# Patient Record
Sex: Male | Born: 1995
Health system: Southern US, Community
[De-identification: ages and names within clinical notes are randomized; demographics above are authoritative.]

## PROBLEM LIST (undated history)

## (undated) HISTORY — PX: CIRCUMCISION: SUR203

---
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
## 2019-08-12 ENCOUNTER — Other Ambulatory Visit: Payer: Self-pay

## 2019-08-12 ENCOUNTER — Encounter (HOSPITAL_BASED_OUTPATIENT_CLINIC_OR_DEPARTMENT_OTHER): Payer: Self-pay

## 2019-08-12 ENCOUNTER — Emergency Department (HOSPITAL_BASED_OUTPATIENT_CLINIC_OR_DEPARTMENT_OTHER)
Admission: EM | Admit: 2019-08-12 | Discharge: 2019-08-12 | Disposition: A | Discharge status: Home / Self Care | Payer: Self-pay | Attending: Emergency Medicine | Admitting: Emergency Medicine

## 2019-08-12 ENCOUNTER — Emergency Department (HOSPITAL_BASED_OUTPATIENT_CLINIC_OR_DEPARTMENT_OTHER): Payer: Self-pay

## 2019-08-12 DIAGNOSIS — R05 Cough: Secondary | ICD-10-CM | POA: Insufficient documentation

## 2019-08-12 DIAGNOSIS — Z20828 Contact with and (suspected) exposure to other viral communicable diseases: Secondary | ICD-10-CM | POA: Insufficient documentation

## 2019-08-12 DIAGNOSIS — B349 Viral infection, unspecified: Secondary | ICD-10-CM | POA: Insufficient documentation

## 2019-08-12 DIAGNOSIS — F1210 Cannabis abuse, uncomplicated: Secondary | ICD-10-CM | POA: Insufficient documentation

## 2019-08-12 DIAGNOSIS — R059 Cough, unspecified: Secondary | ICD-10-CM

## 2019-08-12 NOTE — ED Notes (Signed)
Pt denies fevers, body aches.

## 2019-08-12 NOTE — ED Provider Notes (Signed)
[place] [facility] EMERGENCY DEPARTMENT Provider Note   CSN: [id number] Arrival date & time: 08/12/19  1233     History   Chief Complaint Chief Complaint  Patient presents with  . Cough    HPI Kenneth Salinas is a 23 y.o. male who presents to the ED today complaining of gradual onset, constant, productive cough x 1 week.  Reports that he saw his mother on Sunday and was told that she tested positive for COVID-19 yesterday.  He has not been taking anything for his symptoms.  Denies fever, chills, shortness of breath, diarrhea, sore throat, ear pain, any other associated symptoms.      History reviewed. No pertinent past medical history.  There are no active problems to display for this patient.   Past Surgical History:  Procedure Laterality Date  . CIRCUMCISION          Home Medications    Prior to Admission medications   Medication Sig Start Date End Date Taking? Authorizing Provider  acetaminophen (TYLENOL) 500 MG tablet Take 500 mg by mouth every 6 (six) hours as needed. For pain     [provider]  ALBUTEROL SULFATE IN Inhale into the lungs.    [provider]  cyclobenzaprine (FLEXERIL) 10 MG tablet Take 1 tablet (10 mg total) by mouth 2 (two) times daily as needed for muscle spasms. 05/12/16   Joy, Shawn C, PA-C  erythromycin (ERY-TAB) 250 MG EC tablet Take 250 mg by mouth 4 (four) times daily.    [provider]  guaiFENesin-dextromethorphan (ROBITUSSIN DM) 100-10 MG/5ML syrup Take 5 mLs by mouth 3 (three) times daily as needed for cough. 07/08/13   Rancour, Annie Main, MD  ibuprofen (ADVIL,MOTRIN) 800 MG tablet Take 1 tablet (800 mg total) by mouth 3 (three) times daily. 05/12/16   Lorayne Bender, PA-C    Family History No family history on file.  Social History Social History   Tobacco Use  . Smoking status: Never Smoker  . Smokeless tobacco: Never Used  Substance Use Topics  . Alcohol use: No  . Drug use: Yes    Types:  Marijuana     Allergies   Patient has no known allergies.   Review of Systems Review of Systems  Constitutional: Negative for chills and fever.  HENT: Negative for congestion, ear pain and sore throat.   Respiratory: Positive for cough. Negative for shortness of breath.   Gastrointestinal: Negative for diarrhea.     Physical Exam Updated Vital Signs BP (!) 128/91 (BP Location: Left Arm)   Pulse 61   Temp 98 F (36.7 C) (Oral)   Resp 14   SpO2 100%   Physical Exam Vitals signs and nursing note reviewed.  Constitutional:      Appearance: He is not ill-appearing or diaphoretic.  HENT:     Head: Normocephalic and atraumatic.     Right Ear: Tympanic membrane normal.  Eyes:     Conjunctiva/sclera: Conjunctivae normal.  Cardiovascular:     Rate and Rhythm: Normal rate and regular rhythm.     Pulses: Normal pulses.  Pulmonary:     Effort: Pulmonary effort is normal.     Breath sounds: Normal breath sounds. No wheezing, rhonchi or rales.  Lymphadenopathy:     Cervical: No cervical adenopathy.  Skin:    General: Skin is warm and dry.     Coloration: Skin is not jaundiced.  Neurological:     Mental Status: He is alert.  ED Treatments / Results  Labs (all labs ordered are listed, but only abnormal results are displayed) Labs Reviewed  NOVEL CORONAVIRUS, NAA (HOSP ORDER, SEND-OUT TO REF LAB; TAT 18-24 HRS)    EKG None  Radiology Dg Chest Port 1 View  Result Date: 08/12/2019 CLINICAL DATA:  Cough. EXAM: PORTABLE CHEST 1 VIEW COMPARISON:  July 25, 2015 FINDINGS: No edema or consolidation. The heart size and pulmonary vascularity are normal. No adenopathy. No bone lesions. IMPRESSION: No edema or consolidation.  Cardiac silhouette within normal limits. Electronically Signed   By: Bretta Bang III M.D.   On: 08/12/2019 13:45    Procedures Procedures (including critical care time)  Medications Ordered in ED Medications - No data to display    Initial Impression / Assessment and Plan / ED Course  I have reviewed the triage vital signs and the nursing notes.  Pertinent labs & imaging results that were available during my care of the patient were reviewed by me and considered in my medical decision making (see chart for details).    23 year old male who has had positive COVID-19 exposure with his mother with productive cough for the past week who presents to the ED today.  Vital signs are stable.  Patient afebrile without tachycardia or tachypnea.  His lungs are clear to auscultation bilaterally but given he has had productive cough will obtain chest x-ray today.  Will swab for Covid with outpatient testing.  Patient advised that he will need to stay home and self isolate until he receives his results.  He is advised that even if he test positive that I would suggest staying home until he is symptom-free for about a week.  He does not currently work or go to school and therefore cannot stay home.  Discharge after chest x-ray obtained.  If negative patient will not need any other additional medications at this time.   X-ray negative.  Discharge at this time with instructions to self isolate until he receives his results.  Strict return precautions have been discussed with patient.  He is in agreement with plan at this time and stable for discharge home.   Kenneth Salinas was evaluated in Emergency Department on 08/12/2019 for the symptoms described in the history of present illness. He was evaluated in the context of the global COVID-19 pandemic, which necessitated consideration that the patient might be at risk for infection with the SARS-CoV-2 virus that causes COVID-19. Institutional protocols and algorithms that pertain to the evaluation of patients at risk for COVID-19 are in a state of rapid change based on information released by regulatory bodies including the CDC and federal and state organizations. These policies and algorithms were  followed during the patient's care in the ED.  This note was prepared using Dragon voice recognition software and may include unintentional dictation errors due to the inherent limitations of voice recognition software.    Final Clinical Impressions(s) / ED Diagnoses   Final diagnoses:  Cough  Viral illness    ED Discharge Orders    None       Tanda Rockers, PA-C 08/12/19 1402    Vanetta Mulders, MD 08/21/19 [phone]

## 2019-08-12 NOTE — ED Triage Notes (Addendum)
Pt c/o prod cough x 1 week with +covid exposure-NAD-steady gait

## 2019-08-12 NOTE — Discharge Instructions (Addendum)
Your chest xray was clear. We have sent out a covid swab on you - this can take up to 3 days to return. STAY HOME AND SELF ISOLATE UNTIL YOU RECEIVE YOUR RESULTS. If positive you will need to stay home for 14 days starting today. If negative I would still recommend you staying home until you are symptom free for at least 1 week. Continue monitoring your symptoms at home and checking your temperature daily.      Person Under Monitoring Name: Kenneth Salinas  Location: [address] [place] [address]   Infection Prevention Recommendations for Individuals Confirmed to have, or Being Evaluated for, 2019 Novel Coronavirus (COVID-19) Infection Who Receive Care at Home  Individuals who are confirmed to have, or are being evaluated for, COVID-19 should follow the prevention steps below until a healthcare provider or local or state health department says they can return to normal activities.  Stay home except to get medical care You should restrict activities outside your home, except for getting medical care. Do not go to work, school, or public areas, and do not use public transportation or taxis.  Call ahead before visiting your doctor Before your medical appointment, call the healthcare provider and tell them that you have, or are being evaluated for, COVID-19 infection. This will help the healthcare providers office take steps to keep other people from getting infected. Ask your healthcare provider to call the local or state health department.  Monitor your symptoms Seek prompt medical attention if your illness is worsening (e.g., difficulty breathing). Before going to your medical appointment, call the healthcare provider and tell them that you have, or are being evaluated for, COVID-19 infection. Ask your healthcare provider to call the local or state health department.  Wear a facemask You should wear a facemask that covers your nose and mouth when you are in the same room  with other people and when you visit a healthcare provider. People who live with or visit you should also wear a facemask while they are in the same room with you.  Separate yourself from other people in your home As much as possible, you should stay in a different room from other people in your home. Also, you should use a separate bathroom, if available.  Avoid sharing household items You should not share dishes, drinking glasses, cups, eating utensils, towels, bedding, or other items with other people in your home. After using these items, you should wash them thoroughly with soap and water.  Cover your coughs and sneezes Cover your mouth and nose with a tissue when you cough or sneeze, or you can cough or sneeze into your sleeve. Throw used tissues in a lined trash can, and immediately wash your hands with soap and water for at least 20 seconds or use an alcohol-based hand rub.  Wash your [employer] your hands often and thoroughly with soap and water for at least 20 seconds. You can use an alcohol-based hand sanitizer if soap and water are not available and if your hands are not visibly dirty. Avoid touching your eyes, nose, and mouth with unwashed hands.   Prevention Steps for Caregivers and Household Members of Individuals Confirmed to have, or Being Evaluated for, COVID-19 Infection Being Cared for in the Home  If you live with, or provide care at home for, a person confirmed to have, or being evaluated for, COVID-19 infection please follow these guidelines to prevent infection:  Follow healthcare providers instructions Make sure that you  understand and can help the patient follow any healthcare provider instructions for all care.  Provide for the patients basic needs You should help the patient with basic needs in the home and provide support for getting groceries, prescriptions, and other personal needs.  Monitor the patients symptoms If they are getting sicker, call  his or her medical provider and tell them that the patient has, or is being evaluated for, COVID-19 infection. This will help the healthcare providers office take steps to keep other people from getting infected. Ask the healthcare provider to call the local or state health department.  Limit the number of people who have contact with the patient If possible, have only one caregiver for the patient. Other household members should stay in another home or place of residence. If this is not possible, they should stay in another room, or be separated from the patient as much as possible. Use a separate bathroom, if available. Restrict visitors who do not have an essential need to be in the home.  Keep older adults, very young children, and other sick people away from the patient Keep older adults, very young children, and those who have compromised immune systems or chronic health conditions away from the patient. This includes people with chronic heart, lung, or kidney conditions, diabetes, and cancer.  Ensure good ventilation Make sure that shared spaces in the home have good air flow, such as from an air conditioner or an opened window, weather permitting.  Wash your hands often Wash your hands often and thoroughly with soap and water for at least 20 seconds. You can use an alcohol based hand sanitizer if soap and water are not available and if your hands are not visibly dirty. Avoid touching your eyes, nose, and mouth with unwashed hands. Use disposable paper towels to dry your hands. If not available, use dedicated cloth towels and replace them when they become wet.  Wear a facemask and gloves Wear a disposable facemask at all times in the room and gloves when you touch or have contact with the patients blood, body fluids, and/or secretions or excretions, such as sweat, saliva, sputum, nasal mucus, vomit, urine, or feces.  Ensure the mask fits over your nose and mouth tightly, and do not  touch it during use. Throw out disposable facemasks and gloves after using them. Do not reuse. Wash your hands immediately after removing your facemask and gloves. If your personal clothing becomes contaminated, carefully remove clothing and launder. Wash your hands after handling contaminated clothing. Place all used disposable facemasks, gloves, and other waste in a lined container before disposing them with other household waste. Remove gloves and wash your hands immediately after handling these items.  Do not share dishes, glasses, or other household items with the patient Avoid sharing household items. You should not share dishes, drinking glasses, cups, eating utensils, towels, bedding, or other items with a patient who is confirmed to have, or being evaluated for, COVID-19 infection. After the person uses these items, you should wash them thoroughly with soap and water.  Wash laundry thoroughly Immediately remove and wash clothes or bedding that have blood, body fluids, and/or secretions or excretions, such as sweat, saliva, sputum, nasal mucus, vomit, urine, or feces, on them. Wear gloves when handling laundry from the patient. Read and follow directions on labels of laundry or clothing items and detergent. In general, wash and dry with the warmest temperatures recommended on the label.  Clean all areas the individual has used often  Clean all touchable surfaces, such as counters, tabletops, doorknobs, bathroom fixtures, toilets, phones, keyboards, tablets, and bedside tables, every day. Also, clean any surfaces that may have blood, body fluids, and/or secretions or excretions on them. Wear gloves when cleaning surfaces the patient has come in contact with. Use a diluted bleach solution (e.g., dilute bleach with 1 part bleach and 10 parts water) or a household disinfectant with a label that says EPA-registered for coronaviruses. To make a bleach solution at home, add 1 tablespoon of bleach  to 1 quart (4 cups) of water. For a larger supply, add  cup of bleach to 1 gallon (16 cups) of water. Read labels of cleaning products and follow recommendations provided on product labels. Labels contain instructions for safe and effective use of the cleaning product including precautions you should take when applying the product, such as wearing gloves or eye protection and making sure you have good ventilation during use of the product. Remove gloves and wash hands immediately after cleaning.  Monitor yourself for signs and symptoms of illness Caregivers and household members are considered close contacts, should monitor their health, and will be asked to limit movement outside of the home to the extent possible. Follow the monitoring steps for close contacts listed on the symptom monitoring form.   ? If you have additional questions, contact your local health department or call the epidemiologist on call at [phone] (available 24/7). ? This guidance is subject to change. For the most up-to-date guidance from [facility], please refer to their website: [email or web address]

## 2019-08-12 NOTE — ED Notes (Signed)
X-ray at bedside

## 2019-08-14 ENCOUNTER — Telehealth: Payer: Self-pay | Admitting: General Practice

## 2019-08-14 LAB — NOVEL CORONAVIRUS, NAA (HOSP ORDER, SEND-OUT TO REF LAB; TAT 18-24 HRS): SARS-CoV-2, NAA: NOT DETECTED

## 2019-08-14 NOTE — Telephone Encounter (Signed)
Negative COVID results given. Patient results "NOT Detected." Caller expressed understanding. ° °

## 2019-10-25 ENCOUNTER — Emergency Department (HOSPITAL_BASED_OUTPATIENT_CLINIC_OR_DEPARTMENT_OTHER)
Admission: EM | Admit: 2019-10-25 | Discharge: 2019-10-26 | Disposition: A | Discharge status: Home / Self Care | Payer: Medicaid Other | Attending: Emergency Medicine | Admitting: Emergency Medicine

## 2019-10-25 ENCOUNTER — Encounter (HOSPITAL_BASED_OUTPATIENT_CLINIC_OR_DEPARTMENT_OTHER): Payer: Self-pay | Admitting: *Deleted

## 2019-10-25 ENCOUNTER — Other Ambulatory Visit: Payer: Self-pay

## 2019-10-25 DIAGNOSIS — T50905A Adverse effect of unspecified drugs, medicaments and biological substances, initial encounter: Secondary | ICD-10-CM

## 2019-10-25 DIAGNOSIS — T407X5A Adverse effect of cannabis (derivatives), initial encounter: Secondary | ICD-10-CM | POA: Insufficient documentation

## 2019-10-25 DIAGNOSIS — F12929 Cannabis use, unspecified with intoxication, unspecified: Secondary | ICD-10-CM

## 2019-10-25 NOTE — ED Triage Notes (Signed)
Pt reports he woke up this evening with his heart racing. States "it was beating too fast and I felt weird". HR 66 in triage with O2 sats 99%. Pt admits to smoking weed tonight.

## 2019-10-26 ENCOUNTER — Encounter (HOSPITAL_BASED_OUTPATIENT_CLINIC_OR_DEPARTMENT_OTHER): Payer: Self-pay | Admitting: Emergency Medicine

## 2019-10-26 NOTE — ED Provider Notes (Signed)
[facility] EMERGENCY DEPARTMENT Provider Note   CSN: [id number] Arrival date & time: 10/25/19  2310     History Chief Complaint  Patient presents with  . Palpitations    Kenneth Salinas is a 24 y.o. male.  The history is provided by the patient.  Palpitations Palpitations quality:  Fast Onset quality:  Sudden Timing:  Constant Progression:  Resolved Chronicity:  New Context: illicit drugs   Context comment:  Marijuana Relieved by: a measure of time. Worsened by:  Nothing Ineffective treatments:  None tried Associated symptoms: no back pain, no chest pain, no chest pressure, no cough, no diaphoresis, no dizziness and no hemoptysis   Risk factors: no diabetes mellitus   Patient with palpitations immediately after smoking marijuana.      History reviewed. No pertinent past medical history.  There are no problems to display for this patient.   Past Surgical History:  Procedure Laterality Date  . CIRCUMCISION         History reviewed. No pertinent family history.  Social History   Tobacco Use  . Smoking status: Never Smoker  . Smokeless tobacco: Never Used  Substance Use Topics  . Alcohol use: No  . Drug use: Yes    Types: Marijuana    Home Medications Prior to Admission medications   Medication Sig Start Date End Date Taking? Authorizing Provider  acetaminophen (TYLENOL) 500 MG tablet Take 500 mg by mouth every 6 (six) hours as needed. For pain     [provider]  ALBUTEROL SULFATE IN Inhale into the lungs.    [provider]  cyclobenzaprine (FLEXERIL) 10 MG tablet Take 1 tablet (10 mg total) by mouth 2 (two) times daily as needed for muscle spasms. 05/12/16   Joy, Shawn C, PA-C  erythromycin (ERY-TAB) 250 MG EC tablet Take 250 mg by mouth 4 (four) times daily.    [provider]  guaiFENesin-dextromethorphan (ROBITUSSIN DM) 100-10 MG/5ML syrup Take 5 mLs by mouth 3 (three) times daily as needed for cough. 07/08/13    Rancour, Jeannett Senior, MD  ibuprofen (ADVIL,MOTRIN) 800 MG tablet Take 1 tablet (800 mg total) by mouth 3 (three) times daily. 05/12/16   Joy, Hillard Danker, PA-C    Allergies    Patient has no known allergies.  Review of Systems   Review of Systems  Constitutional: Negative for diaphoresis.  Respiratory: Negative for cough and hemoptysis.   Cardiovascular: Positive for palpitations. Negative for chest pain.  Musculoskeletal: Negative for back pain.  Neurological: Negative for dizziness.    Physical Exam Updated Vital Signs BP 111/74   Pulse 60   Temp 98.7 F (37.1 C) (Oral)   Resp 17   Ht 5\' 8"  (1.727 m)   Wt 93.4 kg   SpO2 97%   BMI 31.32 kg/m   Physical Exam  ED Results / Procedures / Treatments   Labs (all labs ordered are listed, but only abnormal results are displayed) Labs Reviewed - No data to display  EKG EKG Interpretation  Date/Time:  Saturday October 25 2019 23:32:10 EST Ventricular Rate:  58 PR Interval:    QRS Duration: 97 QT Interval:  381 QTC Calculation: 375 R Axis:   37 Text Interpretation: Sinus rhythm ST elev, probable normal early repol pattern When compared with ECG of 07/25/2015, No significant change was found Confirmed by 09/24/2015 (Dione Booze) on 10/26/2019 1:11:34 AM   Radiology No results found.  Procedures Procedures (including critical care time)  Medications Ordered in ED Medications -  No data to display  ED Course  I have reviewed the triage vital signs and the nursing notes.  Pertinent labs & imaging results that were available during my care of the patient were reviewed by me and considered in my medical decision making (see chart for details).    Observed in the ED without any arrhythmia.  Patient sleeping soundly. Advised against any drug use as this is certainly what caused the symptoms.    Kenneth Salinas was evaluated in Emergency Department on 10/26/2019 for the symptoms described in the history of present illness. He was  evaluated in the context of the global COVID-19 pandemic, which necessitated consideration that the patient might be at risk for infection with the SARS-CoV-2 virus that causes COVID-19. Institutional protocols and algorithms that pertain to the evaluation of patients at risk for COVID-19 are in a state of rapid change based on information released by regulatory bodies including the CDC and federal and state organizations. These policies and algorithms were followed during the patient's care in the ED.   Final Clinical Impression(s) / ED Diagnoses Final diagnoses:  Adverse effect of drug, initial encounter    Return for intractable cough, coughing up blood,fevers >100.4 unrelieved by medication, shortness of breath, intractable vomiting, chest pain, shortness of breath, weakness,numbness, changes in speech, facial asymmetry,abdominal pain, passing out,Inability to tolerate liquids or food, cough, altered mental status or any concerns. No signs of systemic illness or infection. The patient is nontoxic-appearing on exam and vital signs are within normal limits.   I have reviewed the triage vital signs and the nursing notes. Pertinent labs &imaging results that were available during my care of the patient were reviewed by me and considered in my medical decision making (see chart for details).  After history, exam, and medical workup I feel the patient has been appropriately medically screened and is safe for discharge home. Pertinent diagnoses were discussed with the patient. Patient was given return   Larned, Tris Howell, MD 10/26/19 [phone]

## 2020-09-18 ENCOUNTER — Emergency Department (HOSPITAL_BASED_OUTPATIENT_CLINIC_OR_DEPARTMENT_OTHER): Payer: Medicaid Other

## 2020-09-18 ENCOUNTER — Emergency Department (HOSPITAL_BASED_OUTPATIENT_CLINIC_OR_DEPARTMENT_OTHER)
Admission: EM | Admit: 2020-09-18 | Discharge: 2020-09-18 | Disposition: A | Discharge status: Home / Self Care | Payer: Medicaid Other | Attending: Emergency Medicine | Admitting: Emergency Medicine

## 2020-09-18 ENCOUNTER — Other Ambulatory Visit: Payer: Self-pay

## 2020-09-18 ENCOUNTER — Encounter (HOSPITAL_BASED_OUTPATIENT_CLINIC_OR_DEPARTMENT_OTHER): Payer: Self-pay | Admitting: Emergency Medicine

## 2020-09-18 DIAGNOSIS — Z5321 Procedure and treatment not carried out due to patient leaving prior to being seen by health care provider: Secondary | ICD-10-CM | POA: Insufficient documentation

## 2020-09-18 DIAGNOSIS — Z20822 Contact with and (suspected) exposure to covid-19: Secondary | ICD-10-CM | POA: Insufficient documentation

## 2020-09-18 DIAGNOSIS — R059 Cough, unspecified: Secondary | ICD-10-CM | POA: Insufficient documentation

## 2020-09-18 LAB — RESP PANEL BY RT-PCR (FLU A&B, COVID) ARPGX2
Influenza A by PCR: NEGATIVE
Influenza B by PCR: NEGATIVE
SARS Coronavirus 2 by RT PCR: NEGATIVE

## 2020-09-18 LAB — GROUP A STREP BY PCR: Group A Strep by PCR: NOT DETECTED

## 2020-09-18 NOTE — ED Triage Notes (Signed)
Pt arrives pov with c/o sore throat yesterday and cough today. Pt denies fever, not vaccinated for Covid. Pt also endorses emesis, reports that he thinks r/t coughing

## 2020-09-18 NOTE — ED Notes (Signed)
Called X 3 with no answer.  Removed from board at this time.

## 2020-11-29 ENCOUNTER — Other Ambulatory Visit: Payer: Self-pay

## 2020-11-29 ENCOUNTER — Other Ambulatory Visit: Payer: Medicaid Other

## 2020-11-29 DIAGNOSIS — Z20822 Contact with and (suspected) exposure to covid-19: Secondary | ICD-10-CM

## 2020-11-30 LAB — SARS-COV-2, NAA 2 DAY TAT

## 2020-11-30 LAB — NOVEL CORONAVIRUS, NAA: SARS-CoV-2, NAA: NOT DETECTED

## 2021-05-29 IMAGING — DX DG CHEST 1V PORT
1 series · 1 of 1 positions shown · non-contrast
Comparison: July 25, 2015

CLINICAL DATA: Cough.

EXAM:
PORTABLE CHEST 1 VIEW

[chest ap]
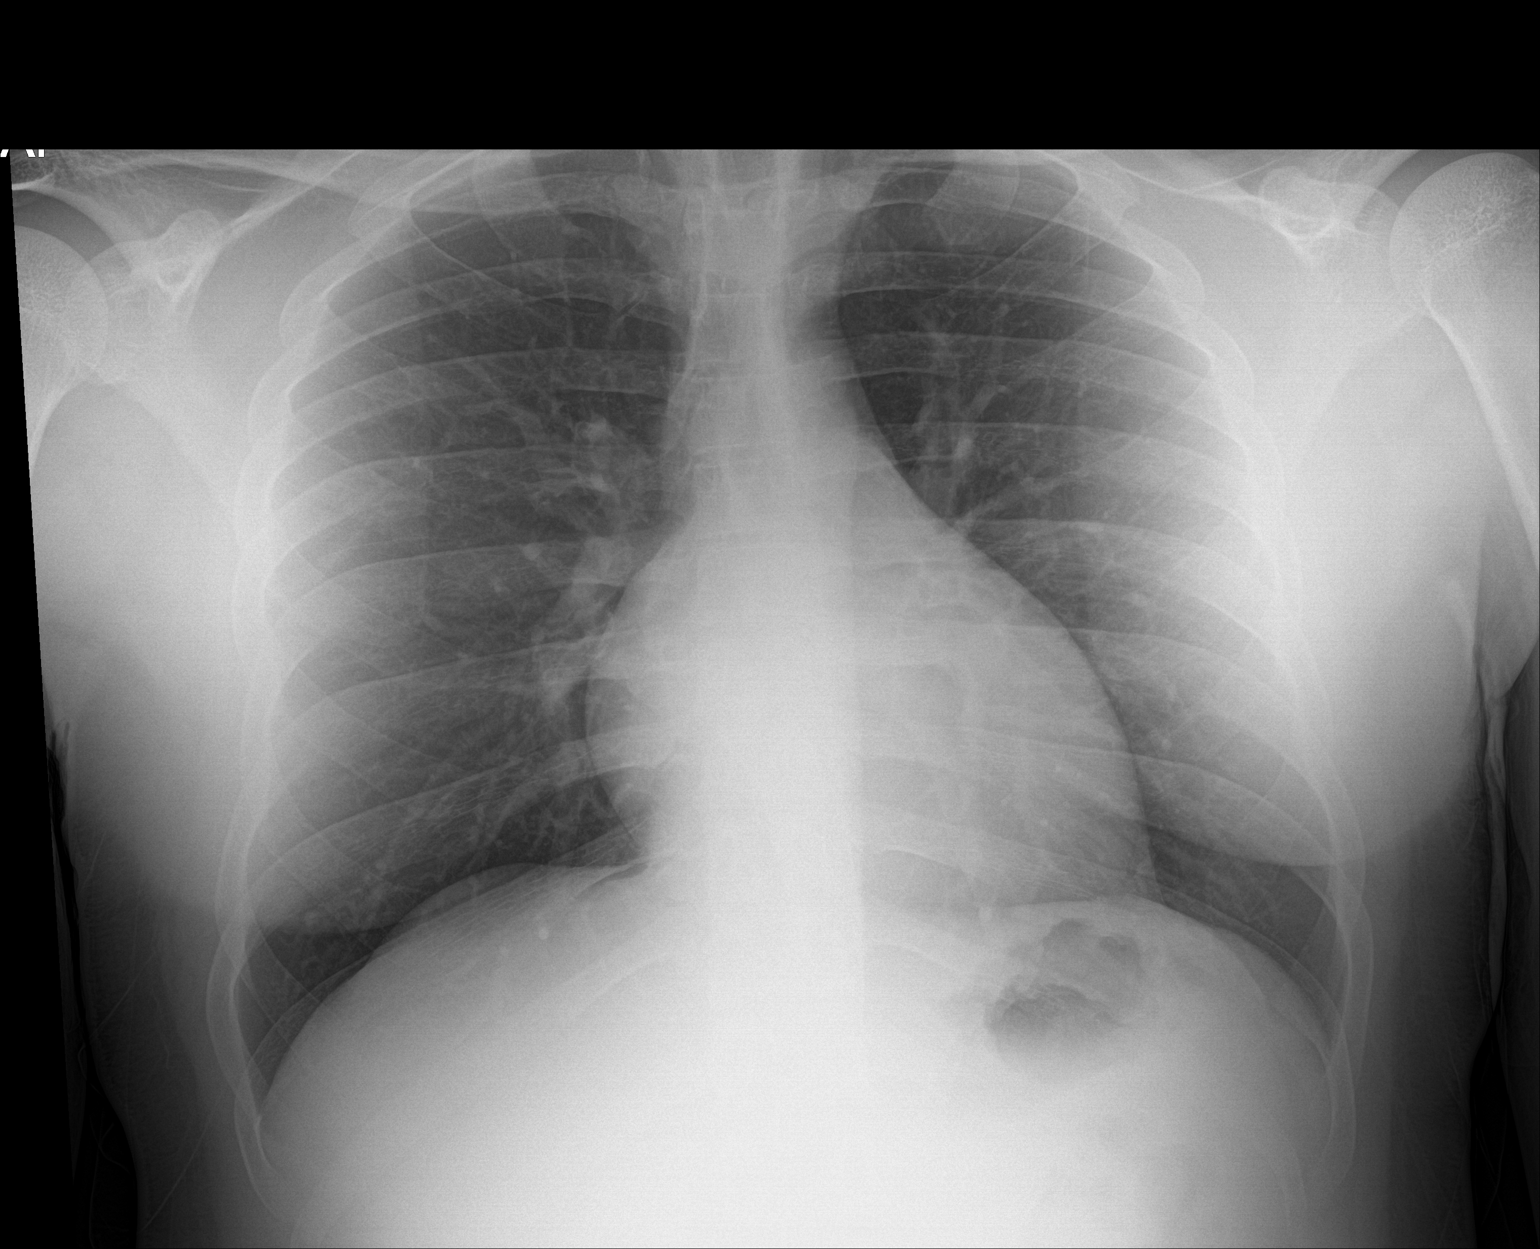

[1 of 1 positions shown; findings below may reference images not displayed]

FINDINGS: No edema or consolidation. The heart size and pulmonary vascularity
are normal. No adenopathy. No bone lesions.
IMPRESSION: No edema or consolidation.  Cardiac silhouette within normal limits.

## 2022-03-28 ENCOUNTER — Emergency Department (HOSPITAL_BASED_OUTPATIENT_CLINIC_OR_DEPARTMENT_OTHER)
Admission: EM | Admit: 2022-03-28 | Discharge: 2022-03-28 | Disposition: A | Discharge status: Home / Self Care | Payer: Medicaid Other | Attending: Emergency Medicine | Admitting: Emergency Medicine

## 2022-03-28 ENCOUNTER — Other Ambulatory Visit: Payer: Self-pay

## 2022-03-28 ENCOUNTER — Encounter (HOSPITAL_BASED_OUTPATIENT_CLINIC_OR_DEPARTMENT_OTHER): Payer: Self-pay | Admitting: Emergency Medicine

## 2022-03-28 DIAGNOSIS — T161XXA Foreign body in right ear, initial encounter: Secondary | ICD-10-CM | POA: Insufficient documentation

## 2022-03-28 DIAGNOSIS — X58XXXA Exposure to other specified factors, initial encounter: Secondary | ICD-10-CM | POA: Insufficient documentation

## 2022-03-28 NOTE — ED Triage Notes (Signed)
Pt reports bug flew in his right ear

## 2022-03-28 NOTE — ED Provider Notes (Signed)
   [facility]  Provider Note  CSN: [id number] Arrival date & time: 03/28/22 2329  History Chief Complaint  Patient presents with   Foreign Body in [place] is a 26 y.o. male reports a bug flew in his right ear a short time prior to arrival. Still feels itchy/irritated, but does not feel movement. No bleeding, drainage or change in hearing.    Home Medications Prior to Admission medications   Medication Sig Start Date End Date Taking? Authorizing Provider  acetaminophen (TYLENOL) 500 MG tablet Take 500 mg by mouth every 6 (six) hours as needed. For pain     [provider]  ALBUTEROL SULFATE IN Inhale into the lungs.    [provider]  cyclobenzaprine (FLEXERIL) 10 MG tablet Take 1 tablet (10 mg total) by mouth 2 (two) times daily as needed for muscle spasms. 05/12/16   Joy, Shawn C, PA-C  erythromycin (ERY-TAB) 250 MG EC tablet Take 250 mg by mouth 4 (four) times daily.    [provider]  guaiFENesin-dextromethorphan (ROBITUSSIN DM) 100-10 MG/5ML syrup Take 5 mLs by mouth 3 (three) times daily as needed for cough. 07/08/13   Rancour, Annie Main, MD  ibuprofen (ADVIL,MOTRIN) 800 MG tablet Take 1 tablet (800 mg total) by mouth 3 (three) times daily. 05/12/16   Joy, Helane Gunther, PA-C     Allergies    Patient has no known allergies.   Review of Systems   Review of Systems Please see HPI for pertinent positives and negatives  Physical Exam BP 119/73 (BP Location: Left Arm)   Pulse (!) 54   Temp 98.2 F (36.8 C) (Oral)   Resp 18   Ht 5\' 8"  (1.727 m)   Wt 104.3 kg   SpO2 98%   BMI 34.97 kg/m   Physical Exam Vitals and nursing note reviewed.  HENT:     Head: Normocephalic.     Right Ear: Tympanic membrane and ear canal normal.     Ears:     Comments: No FB, small amount of wax    Nose: Nose normal.  Eyes:     Extraocular Movements: Extraocular movements intact.  Pulmonary:     Effort: Pulmonary effort is  normal.  Musculoskeletal:        General: Normal range of motion.     Cervical back: Neck supple.  Skin:    Findings: No rash (on exposed skin).  Neurological:     Mental Status: He is alert and oriented to person, place, and time.  Psychiatric:        Mood and Affect: Mood normal.     ED Results / Procedures / Treatments   EKG None  Procedures Procedures  Medications Ordered in the ED Medications - No data to display  Initial Impression and Plan  Patient with possible FB in R ear, none seen on exam. Small amount of wax is not in need of removal. No further ED needs expressed.   ED Course       MDM Rules/Calculators/A&P Medical Decision Making Problems Addressed: Foreign body of right ear, initial encounter: self-limited or minor problem    Final Clinical Impression(s) / ED Diagnoses Final diagnoses:  Foreign body of right ear, initial encounter    Rx / DC Orders ED Discharge Orders     None        Truddie Hidden, MD 03/28/22 2341

## 2022-05-19 ENCOUNTER — Other Ambulatory Visit: Payer: Self-pay

## 2022-05-19 ENCOUNTER — Encounter (HOSPITAL_BASED_OUTPATIENT_CLINIC_OR_DEPARTMENT_OTHER): Payer: Self-pay | Admitting: Urology

## 2022-05-19 ENCOUNTER — Emergency Department (HOSPITAL_BASED_OUTPATIENT_CLINIC_OR_DEPARTMENT_OTHER): Payer: Medicaid Other

## 2022-05-19 ENCOUNTER — Emergency Department (HOSPITAL_BASED_OUTPATIENT_CLINIC_OR_DEPARTMENT_OTHER)
Admission: EM | Admit: 2022-05-19 | Discharge: 2022-05-19 | Disposition: A | Discharge status: Home / Self Care | Payer: Medicaid Other | Attending: Emergency Medicine | Admitting: Emergency Medicine

## 2022-05-19 DIAGNOSIS — Z20822 Contact with and (suspected) exposure to covid-19: Secondary | ICD-10-CM | POA: Insufficient documentation

## 2022-05-19 DIAGNOSIS — R051 Acute cough: Secondary | ICD-10-CM

## 2022-05-19 DIAGNOSIS — J209 Acute bronchitis, unspecified: Secondary | ICD-10-CM | POA: Insufficient documentation

## 2022-05-19 LAB — RESP PANEL BY RT-PCR (FLU A&B, COVID) ARPGX2
Influenza A by PCR: NEGATIVE
Influenza B by PCR: NEGATIVE
SARS Coronavirus 2 by RT PCR: NEGATIVE

## 2022-05-19 MED ORDER — BENZONATATE 100 MG PO CAPS: 100.0000 mg | ORAL_CAPSULE | Freq: Three times a day (TID) | ORAL | 0 refills | Status: AC

## 2022-05-19 MED ORDER — GUAIFENESIN 100 MG/5ML PO LIQD
5.0000 mL | Freq: Once | ORAL | Status: AC
  Administered 2022-05-19: 5 mL via ORAL
  Filled 2022-05-19: qty 10

## 2022-05-19 MED ORDER — ACETAMINOPHEN 325 MG PO TABS
650.0000 mg | ORAL_TABLET | Freq: Once | ORAL | Status: AC
  Administered 2022-05-19: 650 mg via ORAL
  Filled 2022-05-19: qty 2

## 2022-05-19 MED ORDER — PREDNISONE 20 MG PO TABS: 40.0000 mg | ORAL_TABLET | Freq: Every day | ORAL | 0 refills | Status: AC

## 2022-05-19 NOTE — Discharge Instructions (Addendum)
Take the entire course of steroids as prescribed, as we discussed they may give you some insomnia, increased appetite, moodiness, as well as temporary weight gain.  He can use the cough medicine I have prescribed versus over-the-counter methods such as honey, Robitussin, or other.  I recommend that you follow-up with urgent care or your primary care doctor to ensure that your symptoms are resolving, please return to the emergency department if you have significant worsening shortness of breath, chest pain.

## 2022-05-19 NOTE — ED Provider Notes (Signed)
[facility] EMERGENCY DEPARTMENT Provider Note   CSN: [id number] Arrival date & time: 05/19/22  1117     History  Chief Complaint  Patient presents with   Cough    Kenneth Salinas is a 26 y.o. male with no significant past medical history who presents with concern for productive cough for last 10 days.  Patient reports he has been using Mucinex with no relief.  Denies fever, chills, but does endorse decreased appetite.  Denies diarrhea, constipation.  Patient with elevated heart rate on arrival, denies chest pain, feeling of lightheadedness.  Does endorse some mild shortness of breath.   Cough      Home Medications Prior to Admission medications   Medication Sig Start Date End Date Taking? Authorizing Provider  benzonatate (TESSALON) 100 MG capsule Take 1 capsule (100 mg total) by mouth every 8 (eight) hours. 05/19/22  Yes Lexani Corona H, PA-C  predniSONE (DELTASONE) 20 MG tablet Take 2 tablets (40 mg total) by mouth daily. 05/19/22  Yes Waller Marcussen H, PA-C  acetaminophen (TYLENOL) 500 MG tablet Take 500 mg by mouth every 6 (six) hours as needed. For pain     [provider]  ALBUTEROL SULFATE IN Inhale into the lungs.    [provider]  cyclobenzaprine (FLEXERIL) 10 MG tablet Take 1 tablet (10 mg total) by mouth 2 (two) times daily as needed for muscle spasms. 05/12/16   Joy, Shawn C, PA-C  erythromycin (ERY-TAB) 250 MG EC tablet Take 250 mg by mouth 4 (four) times daily.    [provider]  guaiFENesin-dextromethorphan (ROBITUSSIN DM) 100-10 MG/5ML syrup Take 5 mLs by mouth 3 (three) times daily as needed for cough. 07/08/13   Rancour, Jeannett Senior, MD  ibuprofen (ADVIL,MOTRIN) 800 MG tablet Take 1 tablet (800 mg total) by mouth 3 (three) times daily. 05/12/16   Joy, Hillard Danker, PA-C      Allergies    Patient has no known allergies.    Review of Systems   Review of Systems  Respiratory:  Positive for cough.   All other systems reviewed  and are negative.   Physical Exam Updated Vital Signs BP 116/88   Pulse (!) 105   Temp 99.7 F (37.6 C) (Oral)   Resp (!) 21   Ht 5\' 8"  (1.727 m)   Wt 104.3 kg   SpO2 94%   BMI 34.97 kg/m  Physical Exam Vitals and nursing note reviewed.  Constitutional:      General: He is not in acute distress.    Appearance: Normal appearance.  HENT:     Head: Normocephalic and atraumatic.     Comments: Minimal posterior oropharynx erythema, no significant tonsillar exudate, uvular deviation, evidence of PTA, no floor of mouth redness, swelling Eyes:     General:        Right eye: No discharge.        Left eye: No discharge.  Cardiovascular:     Rate and Rhythm: Normal rate and regular rhythm.  Pulmonary:     Effort: Pulmonary effort is normal. No respiratory distress.  Musculoskeletal:        General: No deformity.  Skin:    General: Skin is warm and dry.  Neurological:     Mental Status: He is alert and oriented to person, place, and time.  Psychiatric:        Mood and Affect: Mood normal.        Behavior: Behavior normal.     ED Results /  Procedures / Treatments   Labs (all labs ordered are listed, but only abnormal results are displayed) Labs Reviewed  RESP PANEL BY RT-PCR (FLU A&B, COVID) [id number]    EKG None  Radiology DG Chest 2 View  Result Date: 05/19/2022 CLINICAL DATA:  Short of breath, cough EXAM: CHEST - 2 VIEW COMPARISON:  09/18/2020 FINDINGS: The heart size and mediastinal contours are within normal limits. Both lungs are clear. No pleural effusion. The visualized skeletal structures are unremarkable. IMPRESSION: No acute process in the chest. Electronically Signed   By: Guadlupe Spanish M.D.   On: 05/19/2022 12:01    Procedures Procedures    Medications Ordered in ED Medications  acetaminophen (TYLENOL) tablet 650 mg (650 mg Oral Given 05/19/22 1228)  guaiFENesin (ROBITUSSIN) 100 MG/5ML liquid 5 mL (5 mLs Oral Given 05/19/22 1228)    ED Course/ Medical  Decision Making/ A&P                           Medical Decision Making Amount and/or Complexity of Data Reviewed Radiology: ordered.  Risk OTC drugs. Prescription drug management.   This is an overall well-appearing 26 year old male who presents with concern for cough, congestion, productive sputum, and runny nose for the last 10 days.  Patient denies fever but does endorse some mild shortness of breath.  No previous history of asthma, patient denies chest pain.  Denies nausea, vomiting, diarrhea.  He denies any known recent sick contacts.  Emergent differential diagnosis includes acute upper respiratory infection, COVID, flu, acute bronchitis, new onset asthma or asthma exacerbation, less clinical concern for PE without risk factors, patient with very low Wells score although he is tachycardic on arrival.  Tachycardia significantly improved with rest, Tylenol, and pulse of only 105 at time of discharge.  Patient with no recent travel, no calf swelling, tenderness, no hemoptysis, no previous blood clots.  I dependently interpreted lab results including RVP which is negative for COVID, flu.  I independently interpreted imaging including plain film chest x-ray which shows no intrathoracic abnormality. I agree with the radiologist interpretation.  EKG is personally interpreted by me as well as my attending physician Dr. Donnald Garre and shows sinus tachycardia without significant abnormality.  Patient with improvement of symptoms after Tylenol and Robitussin.  Symptoms seem consistent with acute bronchitis as his cough has been present for over 10 days, he has some rhonchi, as well as upper respiratory infectious symptoms without known source.  Discussed treatment with prednisone and antitussives versus antitussives alone as well as time.  Patient opts for short course of steroids as symptoms have been ongoing for significant length of time.  Encouraged urgent care PCP follow-up.  Patient discharged  in stable condition at this time.  Extensive return precautions given for worsening shortness of breath, hemoptysis, chest pain.  Final Clinical Impression(s) / ED Diagnoses Final diagnoses:  Acute bronchitis, unspecified organism  Acute cough    Rx / DC Orders ED Discharge Orders          Ordered    benzonatate (TESSALON) 100 MG capsule  Every 8 hours        05/19/22 1411    predniSONE (DELTASONE) 20 MG tablet  Daily        05/19/22 1411              Olene Floss, PA-C 05/19/22 1528    Arby Barrette, MD 05/25/22 2123

## 2022-05-19 NOTE — ED Triage Notes (Signed)
Cough x 10 day , productive Using mucinex with no relief  Denies fever or other associated symptoms

## 2024-05-19 ENCOUNTER — Emergency Department (HOSPITAL_BASED_OUTPATIENT_CLINIC_OR_DEPARTMENT_OTHER)
Admission: EM | Admit: 2024-05-19 | Discharge: 2024-05-19 | Disposition: A | Discharge status: Home / Self Care | Payer: Self-pay | Attending: Emergency Medicine | Admitting: Emergency Medicine

## 2024-05-19 ENCOUNTER — Encounter (HOSPITAL_BASED_OUTPATIENT_CLINIC_OR_DEPARTMENT_OTHER): Payer: Self-pay | Admitting: Emergency Medicine

## 2024-05-19 ENCOUNTER — Emergency Department (HOSPITAL_BASED_OUTPATIENT_CLINIC_OR_DEPARTMENT_OTHER): Payer: Self-pay

## 2024-05-19 ENCOUNTER — Other Ambulatory Visit: Payer: Self-pay

## 2024-05-19 DIAGNOSIS — M25511 Pain in right shoulder: Secondary | ICD-10-CM | POA: Insufficient documentation

## 2024-05-19 MED ORDER — MELOXICAM 15 MG PO TABS: 15.0000 mg | ORAL_TABLET | Freq: Every day | ORAL | 0 refills | Status: AC

## 2024-05-19 NOTE — ED Triage Notes (Signed)
 Rt shoulder popped out of place yesterday  has good pulse and can wiggle fingers  can lift it a little

## 2024-05-19 NOTE — Discharge Instructions (Addendum)
 You have been seen in the Emergency Department (ED) today for a suspected shoulder dislocation.  It is currently in place but please follow up as written with the recommend orthopedic surgeon.    If you have worsening pain or swelling, if the shoulder dislocates again, or if you have any other symptoms that concern you, please return immediately to the Emergency Department.

## 2024-05-19 NOTE — ED Provider Notes (Signed)
 [name] EMERGENCY DEPARTMENT AT [facility] Provider Note   CSN: [id number] Arrival date & time: 05/19/24  1204     Patient presents with: Shoulder Injury   Kenneth Salinas is a 29 y.o. male with no pertinent PMH that presents due to suspected right shoulder dislocation. Patient reports that he was extending his arm back yesterday to be able to throw a football. He then felt a pop in his right shoulder, and felt that his shoulder was out of place on the right side.Patient denies hearing a tearing sound. Patient denies any history of prior injury to this shoulder including no prior shoulder dislocation. Patient reports that up until his presentation today he felt that the shoulder popped out two or three more times with small movements and he was able to manually get his shoulder back in place. Patient feels that the pain is better today than it was yesterday and last night.  Patient denies any numbness or tingling to his right extremity.   The history is provided by the patient.  Shoulder Injury       Prior to Admission medications   Medication Sig Start Date End Date Taking? Authorizing Provider  meloxicam  (MOBIC ) 15 MG tablet Take 1 tablet (15 mg total) by mouth daily for 7 days. 05/19/24 05/26/24 Yes Long, Fonda MATSU, MD  acetaminophen  (TYLENOL ) 500 MG tablet Take 500 mg by mouth every 6 (six) hours as needed. For pain     [provider]  ALBUTEROL SULFATE IN Inhale into the lungs.    [provider]  benzonatate  (TESSALON ) 100 MG capsule Take 1 capsule (100 mg total) by mouth every 8 (eight) hours. 05/19/22   Prosperi, Christian H, PA-C  cyclobenzaprine  (FLEXERIL ) 10 MG tablet Take 1 tablet (10 mg total) by mouth 2 (two) times daily as needed for muscle spasms. 05/12/16   Joy, Shawn C, PA-C  erythromycin (ERY-TAB) 250 MG EC tablet Take 250 mg by mouth 4 (four) times daily.    [provider]  guaiFENesin -dextromethorphan (ROBITUSSIN DM) 100-10 MG/5ML  syrup Take 5 mLs by mouth 3 (three) times daily as needed for cough. 07/08/13   Rancour, Garnette, MD  ibuprofen  (ADVIL ,MOTRIN ) 800 MG tablet Take 1 tablet (800 mg total) by mouth 3 (three) times daily. 05/12/16   Joy, Shawn C, PA-C  predniSONE  (DELTASONE ) 20 MG tablet Take 2 tablets (40 mg total) by mouth daily. 05/19/22   Prosperi, Christian H, PA-C    Allergies: Patient has no known allergies.    Review of Systems Negative except noted in HPI  Updated Vital Signs BP (!) 133/95 (BP Location: Left Arm)   Pulse 66   Temp 98 F (36.7 C)   Resp 18   SpO2 92%   Physical Exam Constitutional:      General: He is not in acute distress. Cardiovascular:     Rate and Rhythm: Normal rate.     Heart sounds: No murmur heard. Pulmonary:     Effort: Pulmonary effort is normal. No respiratory distress.  Musculoskeletal:     Comments: Point tenderness noted to the right shoulder over the greater tubercle. No other joint line tenderness. Patient was neurovascularly intact distally with 5/5 grip strength and palpable radial pulse. Patient was able to flex and extend biceps against resistance with some guarding on the right side. Left shoulder range of motion intact. Right shoulder range of motion limited due to guarding.   Neurological:     Mental Status: He is alert.  Psychiatric:        Mood and Affect: Mood normal.     (all labs ordered are listed, but only abnormal results are displayed) Labs Reviewed - No data to display  EKG: None  Radiology: DG Shoulder Right Result Date: 05/19/2024 CLINICAL DATA:  Trauma to the right shoulder. EXAM: RIGHT SHOULDER - 2+ VIEW COMPARISON:  None Available. FINDINGS: There is no evidence of fracture or dislocation. There is no evidence of arthropathy or other focal bone abnormality. Soft tissues are unremarkable. IMPRESSION: Negative. Electronically Signed   By: Vanetta Chou M.D.   On: 05/19/2024 12:53     Procedures   Medications Ordered in the ED -  No data to display                                  Medical Decision Making Amount and/or Complexity of Data Reviewed Radiology: ordered.  Risk Prescription drug management.  Assessment and plan: Differential includes subacromial impingement, rotator cuff tear or tendinopathy, cervical radiculopathy, biceps tendinopathy, posterior versus anterior dislocation of shoulder, bursitis, arthritis, adhesive capsulitis, fracture, referred pain from flank pain or abdominal or cardiopulmonary causes. On exam, reassuring that patient is neurovascularly intact on the right side.  Patient's pain is also lessened compared to yesterday, although apprehension and guarding present.  X-ray of right shoulder was ordered to rule out any acute fracture or dislocation.  X-ray showed no evidence of fracture or dislocation, no arthropathy and no changes to soft tissue.  This is reassuring that glenohumeral joint is intact at this time.  Patient likely dislocated his shoulder due to overextension from trying to throw the football. will send patient home with sling to be able to rest his arm.  Will also be given 7-day course of meloxicam  for pain.  Will ultimately need to follow-up with orthopedics for resolution of symptoms and/or further workup if needed for persisting pain or dislocation. If worsening pain, swelling,or loss of sensation presents, patient counseled to present back to the ED.    Final diagnoses:  Acute pain of right shoulder    ED Discharge Orders          Ordered    meloxicam  (MOBIC ) 15 MG tablet  Daily        05/19/24 1343               D'Mello, Amaan Meyer, DO 05/19/24 1639    Long, Joshua G, MD 05/22/24 1057
